# Patient Record
Sex: Female | Born: 2005 | Race: Black or African American | Hispanic: No | Marital: Single | Smoking: Never smoker
Health system: Southern US, Community
[De-identification: ages and names within clinical notes are randomized; demographics above are authoritative.]

## PROBLEM LIST (undated history)

## (undated) ENCOUNTER — Emergency Department (HOSPITAL_COMMUNITY): Admission: EM

## (undated) DIAGNOSIS — Z973 Presence of spectacles and contact lenses: Secondary | ICD-10-CM

## (undated) DIAGNOSIS — L309 Dermatitis, unspecified: Secondary | ICD-10-CM

## (undated) DIAGNOSIS — J309 Allergic rhinitis, unspecified: Secondary | ICD-10-CM

## (undated) DIAGNOSIS — J45909 Unspecified asthma, uncomplicated: Secondary | ICD-10-CM

## (undated) DIAGNOSIS — Z9229 Personal history of other drug therapy: Secondary | ICD-10-CM

## (undated) HISTORY — DX: Unspecified asthma, uncomplicated: J45.909

## (undated) HISTORY — PX: NO PAST SURGERIES: SHX2092

---
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
## 2023-06-09 ENCOUNTER — Encounter (HOSPITAL_COMMUNITY): Payer: Self-pay | Admitting: *Deleted

## 2023-06-09 ENCOUNTER — Other Ambulatory Visit: Payer: Self-pay

## 2023-06-09 ENCOUNTER — Other Ambulatory Visit (HOSPITAL_COMMUNITY): Payer: Self-pay

## 2023-06-09 ENCOUNTER — Ambulatory Visit (HOSPITAL_COMMUNITY)
Admission: EM | Admit: 2023-06-09 | Discharge: 2023-06-09 | Disposition: A | Discharge status: Home / Self Care | Payer: Medicaid Other | Attending: Emergency Medicine | Admitting: Emergency Medicine

## 2023-06-09 DIAGNOSIS — L259 Unspecified contact dermatitis, unspecified cause: Secondary | ICD-10-CM

## 2023-06-09 MED ORDER — CETIRIZINE HCL 10 MG PO TABS
10.0000 mg | ORAL_TABLET | Freq: Every day | ORAL | 2 refills | Status: AC
  Filled 2023-06-09: qty 30, 30d supply, fill #0

## 2023-06-09 NOTE — Discharge Instructions (Addendum)
I recommend to start once daily allergy medicine such as zyrtec or allegra. Take once every morning for the next week or so. This will help with itching.  Apply a little bit of hydrocortisone cream to the neck twice daily for the next 6-7 days. This is an anti-itch cream that should help right away  Return if needed. I recommend to follow with primary care provider as well.

## 2023-06-09 NOTE — ED Triage Notes (Signed)
Pt reports she ate Strawberries on Thursday and has had a rash on her neck. Pt reports the rash area under chin gets worse with she gets hot.

## 2023-06-09 NOTE — ED Provider Notes (Signed)
[facility]    CSN: [id number] Arrival date & time: 06/09/23  1437      History   Chief Complaint Chief Complaint  Patient presents with   Rash    HPI Alison Mcmahon is a 17 y.o. female.  4 days ago developed itching rash on the neck Not anywhere else on the body. Not painful.  No shortness of breath, wheezing, swelling, abd pain, NVD No fever  Denies new products, foods, medications, recent travel. Ate strawberries the day before rash started but has eaten them recently with no concern Didn't know if it was a heat rash  History of eczema  Past Medical History:  Diagnosis Date   Allergic rhinitis    Eczema    Up-to-date with immunizations    Wears glasses     Patient Active Problem List   Diagnosis Date Noted   Metatarsus primus varus of right foot    Acquired hallux valgus of right foot    Major osseous defect     Past Surgical History:  Procedure Laterality Date   HALLUX VALGUS LAPIDUS Right 06/15/2021   Procedure: HALLUX VALGUS LAPIDUS; BUNION, BONE GARAFT FROM RIGHT HEEL;  Surgeon: Edwin Cap, DPM;  Location: [facility];  Service: Podiatry;  Laterality: Right;   NO PAST SURGERIES     WISDOM TOOTH EXTRACTION  01/2021    OB History   No obstetric history on file.      Home Medications    Prior to Admission medications   Medication Sig Start Date End Date Taking? Authorizing Provider  cetirizine (ZYRTEC ALLERGY) 10 MG tablet Take 1 tablet (10 mg total) by mouth daily. 06/09/23  Yes Trentan Trippe, Ray Church    Family History History reviewed. No pertinent family history.  Social History Social History   Tobacco Use   Smoking status: Never   Smokeless tobacco: Never  Vaping Use   Vaping Use: Never used  Substance Use Topics   Alcohol use: Never   Drug use: Never     Allergies   Patient has no known allergies.   Review of Systems Review of Systems  Skin:  Positive for rash.   Per HPI  Physical  Exam Triage Vital Signs ED Triage Vitals  Enc Vitals Group     BP 06/09/23 1547 107/74     Pulse Rate 06/09/23 1547 64     Resp 06/09/23 1547 16     Temp 06/09/23 1547 98.1 F (36.7 C)     Temp src --      SpO2 06/09/23 1547 98 %     Weight --      Height --      Head Circumference --      Peak Flow --      Pain Score 06/09/23 1544 0     Pain Loc --      Pain Edu? --      Excl. in GC? --    No data found.  Updated Vital Signs BP 107/74   Pulse 64   Temp 98.1 F (36.7 C)   Resp 16   LMP 06/02/2023   SpO2 98%    Physical Exam Vitals and nursing note reviewed.  Constitutional:      General: She is not in acute distress.    Appearance: Normal appearance. She is not ill-appearing.  HENT:     Right Ear: Tympanic membrane and ear canal normal.     Left Ear: Tympanic membrane and ear canal  normal.     Nose: No congestion or rhinorrhea.     Mouth/Throat:     Mouth: Mucous membranes are moist.     Pharynx: Oropharynx is clear. No posterior oropharyngeal erythema.     Comments: Normal phonation.  No swelling of lips or tongue. Eyes:     Conjunctiva/sclera: Conjunctivae normal.     Pupils: Pupils are equal, round, and reactive to light.  Cardiovascular:     Rate and Rhythm: Normal rate and regular rhythm.     Pulses: Normal pulses.     Heart sounds: Normal heart sounds.  Pulmonary:     Effort: Pulmonary effort is normal.     Breath sounds: Normal breath sounds.     Comments: Clear throughout  Abdominal:     Palpations: Abdomen is soft.  Musculoskeletal:        General: Normal range of motion.     Cervical back: Normal range of motion.  Skin:    Findings: Rash present.     Comments: Faint rash on anterior neck. No vesicles, erythema, warmth, crusting or drainage.   Neurological:     Mental Status: She is alert and oriented to person, place, and time.      UC Treatments / Results  Labs (all labs ordered are listed, but only abnormal results are  displayed) Labs Reviewed - No data to display  EKG   Radiology No results found.  Procedures Procedures   Medications Ordered in UC Medications - No data to display  Initial Impression / Assessment and Plan / UC Course  I have reviewed the triage vital signs and the nursing notes.  Pertinent labs & imaging results that were available during my care of the patient were reviewed by me and considered in my medical decision making (see chart for details).  Well appearing, sating 98% on room air, no acute distress. Contact dermatitis Unknown etiology. Advised to start once daily allergy med for itch relief. Hydrocortisone cream BID for the next week. Monitor for change in symptoms, return if needed, or follow with primary care provider.   Final Clinical Impressions(s) / UC Diagnoses   Final diagnoses:  Contact dermatitis, unspecified contact dermatitis type, unspecified trigger     Discharge Instructions      I recommend to start once daily allergy medicine such as zyrtec or allegra. Take once every morning for the next week or so. This will help with itching.  Apply a little bit of hydrocortisone cream to the neck twice daily for the next 6-7 days. This is an anti-itch cream that should help right away  Return if needed. I recommend to follow with primary care provider as well.    ED Prescriptions     Medication Sig Dispense Auth. Provider   cetirizine (ZYRTEC ALLERGY) 10 MG tablet Take 1 tablet (10 mg total) by mouth daily. 30 tablet Shaquanda Graves, Lurena Joiner, PA-C      PDMP not reviewed this encounter.   Marlow Baars, [place] 06/09/23 1631

## 2024-06-17 ENCOUNTER — Other Ambulatory Visit: Payer: Self-pay

## 2024-06-17 ENCOUNTER — Emergency Department (HOSPITAL_COMMUNITY)
Admission: EM | Admit: 2024-06-17 | Discharge: 2024-06-18 | Discharge status: Against Medical Advice | Attending: Emergency Medicine | Admitting: Emergency Medicine

## 2024-06-17 ENCOUNTER — Encounter (HOSPITAL_COMMUNITY): Payer: Self-pay | Admitting: *Deleted

## 2024-06-17 ENCOUNTER — Emergency Department (HOSPITAL_COMMUNITY)

## 2024-06-17 DIAGNOSIS — R079 Chest pain, unspecified: Secondary | ICD-10-CM | POA: Insufficient documentation

## 2024-06-17 DIAGNOSIS — Z5321 Procedure and treatment not carried out due to patient leaving prior to being seen by health care provider: Secondary | ICD-10-CM | POA: Diagnosis not present

## 2024-06-17 DIAGNOSIS — R072 Precordial pain: Secondary | ICD-10-CM | POA: Diagnosis not present

## 2024-06-17 DIAGNOSIS — J45909 Unspecified asthma, uncomplicated: Secondary | ICD-10-CM | POA: Diagnosis not present

## 2024-06-17 LAB — CBC
HCT: 38.2 % (ref 36.0–46.0)
Hemoglobin: 12.5 g/dL (ref 12.0–15.0)
MCH: 28.5 pg (ref 26.0–34.0)
MCHC: 32.7 g/dL (ref 30.0–36.0)
MCV: 87.2 fL (ref 80.0–100.0)
Platelets: 304 10*3/uL (ref 150–400)
RBC: 4.38 MIL/uL (ref 3.87–5.11)
RDW: 12.9 % (ref 11.5–15.5)
WBC: 5 10*3/uL (ref 4.0–10.5)
nRBC: 0 % (ref 0.0–0.2)

## 2024-06-17 LAB — TROPONIN I (HIGH SENSITIVITY)
Troponin I (High Sensitivity): 3 ng/L (ref <18)
Troponin I (High Sensitivity): <2 ng/L (ref <18)

## 2024-06-17 LAB — BASIC METABOLIC PANEL WITH GFR
Anion gap: 11 (ref 5–15)
BUN: 9 mg/dL (ref 6–20)
CO2: 22 mmol/L (ref 22–32)
Calcium: 9.6 mg/dL (ref 8.9–10.3)
Chloride: 105 mmol/L (ref 98–111)
Creatinine, Ser: 1.02 mg/dL — ABNORMAL HIGH (ref 0.44–1.00)
GFR, Estimated: >60 mL/min (ref >60)
Glucose, Bld: 92 mg/dL (ref 70–99)
Potassium: 3.8 mmol/L (ref 3.5–5.1)
Sodium: 138 mmol/L (ref 135–145)

## 2024-06-17 LAB — HCG, SERUM, QUALITATIVE: Preg, Serum: NEGATIVE

## 2024-06-17 NOTE — ED Provider Triage Note (Signed)
 Emergency Medicine Provider Triage Evaluation Note  Alison Mcmahon , a 18 y.o. female  was evaluated in triage.  Pt complains of chest pain.  Patient reports several months of intermittent episodes of chest pain.  States this happened back in November 2024.  Denies any other associated symptoms at the same time if chest pain arises such as shortness of breath, nausea, vomiting, or epigastric discomfort.  Reports previous workup that was reassuring.  Not on any medications at this time beyond her albuterol inhaler as she states is not helping her symptoms.  Review of Systems  Positive: As above Negative: As above  Physical Exam  BP (!) 138/97 (BP Location: Right Arm)   Pulse 79   Temp 98.8 F (37.1 C) (Oral)   Resp 15   SpO2 100%  Gen:   Awake, no distress   Resp:  Normal effort  MSK:   Moves extremities without difficulty  Other:    Medical Decision Making  Medically screening exam initiated at 4:03 PM.  Appropriate orders placed.  Alison Mcmahon was informed that the remainder of the evaluation will be completed by another provider, this initial triage assessment does not replace that evaluation, and the importance of remaining in the ED until their evaluation is complete.     Joie Reamer A, PA-C 06/17/24 [phone]

## 2024-06-17 NOTE — ED Triage Notes (Signed)
 Pt arrives via POV. PT reports she has been experiencing intermittent chest pain since this last November. PT arrives AxOx4. Denies any associated symptoms at this time.

## 2024-06-18 ENCOUNTER — Other Ambulatory Visit: Payer: Self-pay

## 2024-06-18 ENCOUNTER — Encounter (HOSPITAL_COMMUNITY): Payer: Self-pay | Admitting: Emergency Medicine

## 2024-06-18 ENCOUNTER — Emergency Department (HOSPITAL_COMMUNITY)
Admission: EM | Admit: 2024-06-18 | Discharge: 2024-06-18 | Disposition: A | Discharge status: Home / Self Care | Source: Home / Self Care | Attending: Emergency Medicine | Admitting: Emergency Medicine

## 2024-06-18 DIAGNOSIS — J45909 Unspecified asthma, uncomplicated: Secondary | ICD-10-CM | POA: Insufficient documentation

## 2024-06-18 DIAGNOSIS — R072 Precordial pain: Secondary | ICD-10-CM | POA: Insufficient documentation

## 2024-06-18 LAB — TROPONIN I (HIGH SENSITIVITY)
Troponin I (High Sensitivity): <2 ng/L (ref <18)
Troponin I (High Sensitivity): <2 ng/L (ref <18)

## 2024-06-18 LAB — BASIC METABOLIC PANEL WITH GFR
Anion gap: 11 (ref 5–15)
BUN: 15 mg/dL (ref 6–20)
CO2: 24 mmol/L (ref 22–32)
Calcium: 9.5 mg/dL (ref 8.9–10.3)
Chloride: 106 mmol/L (ref 98–111)
Creatinine, Ser: 0.89 mg/dL (ref 0.44–1.00)
GFR, Estimated: >60 mL/min (ref >60)
Glucose, Bld: 87 mg/dL (ref 70–99)
Potassium: 3.5 mmol/L (ref 3.5–5.1)
Sodium: 141 mmol/L (ref 135–145)

## 2024-06-18 LAB — CBC
HCT: 39.1 % (ref 36.0–46.0)
Hemoglobin: 13.2 g/dL (ref 12.0–15.0)
MCH: 29.1 pg (ref 26.0–34.0)
MCHC: 33.8 g/dL (ref 30.0–36.0)
MCV: 86.1 fL (ref 80.0–100.0)
Platelets: 319 K/uL (ref 150–400)
RBC: 4.54 MIL/uL (ref 3.87–5.11)
RDW: 13.2 % (ref 11.5–15.5)
WBC: 6.1 K/uL (ref 4.0–10.5)
nRBC: 0 % (ref 0.0–0.2)

## 2024-06-18 LAB — HCG, SERUM, QUALITATIVE: Preg, Serum: NEGATIVE

## 2024-06-18 MED ORDER — IBUPROFEN 600 MG PO TABS
600.0000 mg | ORAL_TABLET | Freq: Four times a day (QID) | ORAL | 0 refills | Status: DC | PRN
Start: 2024-06-18 — End: 2024-11-09
  Filled 2024-06-18: qty 30, 8d supply, fill #0

## 2024-06-18 NOTE — ED Triage Notes (Addendum)
 Pt c/o right upper CP intermittent since November, seen yesterday but LWBS after imaging and blood work. Pain worse with deep inhalation. Pt denies oral BC.

## 2024-06-18 NOTE — ED Provider Notes (Signed)
 [place] EMER[facility]ENCY DEPARTMENT AT [facility] Provider Note   CSN: [id number] Arrival date & time: 06/18/24  1730     Patient presents with: Chest Pain   Alison Mcmahon is a 18 y.o. female who presents with a 87-month history of chest discomfort.  Prior workup was initiated yesterday she come in to be seen however due to wait times was unable to stay and left without being seen.  Pain is worsened with increased physical activity.  She endorses pain that is worse with deep inspiration, x-ray obtained at her visit yesterday did not demonstrate any acute cardiopulmonary issues and workup was unremarkable.  She only endorses a prior history of asthma for which she had previously taken albuterol inhaler but has not felt the need to take it, denies any dyspnea, has not had any nocturnal dyspnea, and has not felt the need to use her inhaler and at least the last year.  She does take Zyrtec  for allergic rhinitis.      Chest Pain      Prior to Admission medications   Medication Sig Start Date End Date Taking? Authorizing Provider  ibuprofen  (ADVIL ) 600 MG tablet Take 1 tablet (600 mg total) by mouth every 6 (six) hours as needed. 06/18/24  Yes Myriam Dorn BROCKS, PA  cetirizine  (ZYRTEC  ALLERGY) 10 MG tablet Take 1 tablet (10 mg total) by mouth daily. 06/09/23   Rising, [place], PA-C    Allergies: Patient has no known allergies.    Review of Systems  Cardiovascular:  Positive for chest pain.  All other systems reviewed and are negative.   Updated Vital Signs BP (!) 131/94 (BP Location: Right Arm)   Pulse 83   Temp 98.9 F (37.2 C)   Resp 18   Ht 5' 3 (1.6 m)   Wt 57.2 kg   LMP 06/11/2024 (Exact Date)   SpO2 100%   BMI 22.32 kg/m   Physical Exam Vitals and nursing note reviewed.  Constitutional:      General: She is not in acute distress.    Appearance: Normal appearance.  HENT:     Head: Normocephalic and atraumatic.     Mouth/Throat:     Mouth: Mucous  membranes are moist.     Pharynx: Oropharynx is clear.  Eyes:     Extraocular Movements: Extraocular movements intact.     Conjunctiva/sclera: Conjunctivae normal.     Pupils: Pupils are equal, round, and reactive to light.  Cardiovascular:     Rate and Rhythm: Normal rate and regular rhythm.     Pulses: Normal pulses.     Heart sounds: Normal heart sounds. No murmur heard.    No friction rub. No gallop.  Pulmonary:     Effort: Pulmonary effort is normal.     Breath sounds: Normal breath sounds.  Abdominal:     General: Abdomen is flat. Bowel sounds are normal.     Palpations: Abdomen is soft.  Musculoskeletal:        General: Normal range of motion.     Cervical back: Normal range of motion and neck supple.     Right lower leg: No edema.     Left lower leg: No edema.  Skin:    General: Skin is warm and dry.     Capillary Refill: Capillary refill takes less than 2 seconds.  Neurological:     General: No focal deficit present.     Mental Status: She is alert. Mental status is at baseline.  Psychiatric:        Mood and Affect: Mood normal.     (all labs ordered are listed, but only abnormal results are displayed) Labs Reviewed  BASIC METABOLIC PANEL WITH GFR  CBC  HCG, SERUM, QUALITATIVE  TROPONIN I (HIGH SENSITIVITY)  TROPONIN I (HIGH SENSITIVITY)    EKG: None  Radiology: DG Chest 2 View Result Date: 06/17/2024 CLINICAL DATA:  Chest pain for 7 months. EXAM: CHEST - 2 VIEW COMPARISON:  None Available. FINDINGS: The cardiomediastinal contours are normal. The lungs are clear. Pulmonary vasculature is normal. No consolidation, pleural effusion, or pneumothorax. No acute osseous abnormalities are seen. IMPRESSION: No active cardiopulmonary disease. Electronically Signed   By: Andrea Gasman M.D.   On: 06/17/2024 16:25     Procedures   Medications Ordered in the ED - No data to display                                  Medical Decision Making Amount and/or  Complexity of Data Reviewed Labs: ordered.  Risk Prescription drug management.   Medical Decision Making:   Alison Mcmahon is a 18 y.o. female who presented to the ED today with chest discomfort detailed above.     Complete initial physical exam performed, notably the patient  was alert and oriented and in no apparent distress.  Physical exam as noted, pulmonary and cardiac auscultation are unremarkable..    Reviewed and confirmed nursing documentation for past medical history, family history, social history.    Initial Assessment:   With the patient's presentation of chest discomfort, differential diagnosis includes costochondritis, ACS, pulmonary embolus, pericarditis/myocarditis.  Initial Plan:  Assess hCG to assess for pregnancy Screening labs including CBC and Metabolic panel to evaluate for infectious or metabolic etiology of disease.  Prior CXR reviewed to evaluate for structural/infectious intrathoracic pathology.  EKG and single troponin to evaluate for cardiac pathology. Single troponin appropriate due to greater than 6 hours since maximal intensity of symptoms. Objective evaluation as below reviewed   Initial Study Results:   Laboratory  All laboratory results reviewed without evidence of clinically relevant pathology.   Exceptions include: None  EKG EKG was reviewed independently. Rate, rhythm, axis, intervals all examined and without medically relevant abnormality. ST segments without concerns for elevations.    Radiology:  All images reviewed independently. Agree with radiology report at this time.   DG Chest 2 View Result Date: 06/17/2024 CLINICAL DATA:  Chest pain for 7 months. EXAM: CHEST - 2 VIEW COMPARISON:  None Available. FINDINGS: The cardiomediastinal contours are normal. The lungs are clear. Pulmonary vasculature is normal. No consolidation, pleural effusion, or pneumothorax. No acute osseous abnormalities are seen. IMPRESSION: No active cardiopulmonary  disease. Electronically Signed   By: Andrea Gasman M.D.   On: 06/17/2024 16:25     Reassessment and Plan:   Extensive workup with this patient is largely unremarkable.  As she has had symptoms for over 7 months, and symptoms have remained stable, believe this may be related to nonspecific chest pain/myocarditis.  Will begin patient on a course of outpatient NSAIDs as noted, follow-up with her primary care within the next 1 to 2 weeks.  This was explained to the patient she understands and agrees has no further concerns at this time.       Final diagnoses:  Precordial pain    ED Discharge Orders  Ordered    ibuprofen  (ADVIL ) 600 MG tablet  Every 6 hours PRN        06/18/24 2149               Myriam Dorn BROCKS, [place] 06/18/24 2154    Yolande Lamar BROCKS, MD 06/22/24 [phone]

## 2024-06-18 NOTE — ED Notes (Signed)
Pt name called to go back to see provider, no response.

## 2024-06-19 ENCOUNTER — Other Ambulatory Visit (HOSPITAL_COMMUNITY): Payer: Self-pay

## 2024-06-21 ENCOUNTER — Other Ambulatory Visit (HOSPITAL_COMMUNITY): Payer: Self-pay

## 2024-07-21 ENCOUNTER — Other Ambulatory Visit (HOSPITAL_COMMUNITY): Payer: Self-pay

## 2024-07-21 MED ORDER — HYDROXYZINE HCL 25 MG PO TABS
25.0000 mg | ORAL_TABLET | Freq: Three times a day (TID) | ORAL | 0 refills | Status: DC | PRN
  Filled 2024-07-21: qty 30, 10d supply, fill #0

## 2024-07-26 ENCOUNTER — Other Ambulatory Visit (HOSPITAL_COMMUNITY): Payer: Self-pay

## 2024-07-26 MED ORDER — VITAMIN D (ERGOCALCIFEROL) 1.25 MG (50000 UNIT) PO CAPS
50000.0000 [IU] | ORAL_CAPSULE | ORAL | 0 refills | Status: DC
  Filled 2024-07-26: qty 8, 56d supply, fill #0

## 2024-10-01 ENCOUNTER — Other Ambulatory Visit (HOSPITAL_COMMUNITY): Payer: Self-pay

## 2024-10-01 MED ORDER — IBUPROFEN 600 MG PO TABS
ORAL_TABLET | ORAL | 1 refills | Status: AC
  Filled 2024-10-01: qty 30, 7d supply, fill #0
  Filled 2024-12-23: qty 30, 7d supply, fill #1

## 2024-10-01 MED ORDER — VITAMIN D (ERGOCALCIFEROL) 1.25 MG (50000 UNIT) PO CAPS
ORAL_CAPSULE | ORAL | 0 refills | Status: AC
  Filled 2024-10-01: qty 8, 56d supply, fill #0

## 2024-10-01 MED ORDER — PREDNISONE 20 MG PO TABS
ORAL_TABLET | ORAL | 0 refills | Status: DC
  Filled 2024-10-01: qty 10, 5d supply, fill #0

## 2024-10-06 ENCOUNTER — Other Ambulatory Visit (HOSPITAL_COMMUNITY): Payer: Self-pay | Admitting: General Practice

## 2024-10-06 DIAGNOSIS — R9431 Abnormal electrocardiogram [ECG] [EKG]: Secondary | ICD-10-CM

## 2024-10-06 DIAGNOSIS — R0602 Shortness of breath: Secondary | ICD-10-CM

## 2024-10-20 ENCOUNTER — Ambulatory Visit (HOSPITAL_COMMUNITY)
Admission: RE | Admit: 2024-10-20 | Discharge: 2024-10-20 | Disposition: A | Discharge status: Home / Self Care | Source: Ambulatory Visit | Attending: Family | Admitting: Family

## 2024-10-20 DIAGNOSIS — R079 Chest pain, unspecified: Secondary | ICD-10-CM | POA: Diagnosis present

## 2024-10-20 DIAGNOSIS — R0602 Shortness of breath: Secondary | ICD-10-CM | POA: Diagnosis not present

## 2024-10-20 DIAGNOSIS — R9431 Abnormal electrocardiogram [ECG] [EKG]: Secondary | ICD-10-CM | POA: Insufficient documentation

## 2024-10-20 LAB — ECHOCARDIOGRAM COMPLETE
Area-P 1/2: 3.85 cm2
Calc EF: 63.3 %
S' Lateral: 2.9 cm
Single Plane A2C EF: 66.7 %
Single Plane A4C EF: 60.9 %

## 2024-10-20 NOTE — Progress Notes (Signed)
  Echocardiogram 2D Echocardiogram has been performed.  Devora Ellouise SAUNDERS 10/20/2024, 12:03 PM

## 2024-10-21 ENCOUNTER — Other Ambulatory Visit (HOSPITAL_COMMUNITY): Payer: Self-pay

## 2024-10-21 MED ORDER — OMEPRAZOLE 20 MG PO CPDR
20.0000 mg | DELAYED_RELEASE_CAPSULE | ORAL | 0 refills | Status: AC
  Filled 2024-10-21: qty 30, 30d supply, fill #0

## 2024-10-21 NOTE — Progress Notes (Signed)
 Subjective    Alison Mcmahon is a 18 year old female here for Follow Up (Pt stated she has not got a called back from cardio)  She was last seen in this office on 10/01/24 by this provider. At that time, she was seen for chest pain and SOB. An EKG was completed that showed some abnormalities and she was referred for an ECHO. She was given a short course of prednisone and instructed to use her Albuterol PRN during the SOB episodes.  She is here today alone and states that she got her ECHO completed x yesterday and completed her chest pain log. She brought the log in with her today. She shares that the chest pain occurs mainly around 3pm and varies in duration. It does not appear to be associated with any activity, excessive worrying, or known triggers.    Documentation Reviewed by Any User: Tobacco  Allergies  Meds  Problems   Med Hx  Surg Hx  Fam Hx  Soc Hx    Current Outpatient Medications  Medication Sig Dispense Refill  . ergocalciferol  (VITAMIN D -2) 1,250 mcg (50,000 unit) capsule 1 capsule once weekly for 8 weeks. 8 Capsule 0  . ibuprofen  600 mg tablet Take 1 Tablet by mouth every 6 (six) hours as needed for pain. 30 Tablet 1  . cetirizine  (ZYRTEC ) 10 mg tablet Take 10 mg by mouth.    . hydrOXYzine  HCL (ATARAX ) 25 mg tablet Take 1 Tablet by mouth 3 (three) times daily as needed for anxiety for up to 30 days. 30 Tablet 0  . VENTOLIN HFA 90 mcg/actuation inhaler      Review of Systems  Constitutional:  Negative for appetite change, chills, diaphoresis, fatigue and fever.  Respiratory:  Positive for chest tightness and shortness of breath.   Cardiovascular:  Negative for chest pain and palpitations.  Psychiatric/Behavioral:  Negative for self-injury and suicidal ideas.    Objective    BP 112/79 (Left Arm, Sitting, Regular Adult)  Pulse 83  Temp 98.7 F (37.1 C)  Wt 138 lb (62.6 kg)  LMP 10/10/2024 (Exact Date)  SpO2 97%  BMI 24.45 kg/m  OB Status Having periods  Smoking Status  Never  BSA 1.67 m  Physical Exam Vitals and nursing note reviewed.   Constitutional:      General: She is not in acute distress.    Appearance: Normal appearance. She is normal weight. She is not ill-appearing, toxic-appearing or diaphoretic.   Cardiovascular:     Rate and Rhythm: Normal rate and regular rhythm.     Heart sounds: Normal heart sounds.  Pulmonary:     Effort: Pulmonary effort is normal.     Breath sounds: Normal breath sounds.  Neurological:     Mental Status: She is alert and oriented to person, place, and time. Mental status is at baseline.     Cranial Nerves: No cranial nerve deficit.  Psychiatric:        Mood and Affect: Mood normal.        Behavior: Behavior normal.        Thought Content: Thought content normal.        Judgment: Judgment normal.     Assessment and Plan    1. Chest pain, unspecified type (Primary) 2. Abnormal electrocardiogram (ECG) (EKG) -Reviewed ECHO from 10/20/24 that was unremarkable with an EF: 65-70%.  -Reviewed chest pain log and appears that chest pain comes mainly when lying down; concerns for possible GERD, but she does not note any issues  when eating/skipping meals.  -Instructed to continue using Albuterol PRN SOB episodes and will refer to Cardiology for further workup.  - REFERRAL TO CARDIOLOGY  3. Epigastric abdominal pain -Will trial on Prilosec to r/o epigastric pain/GERD as etiology. May also consider h.pylori testing in the future if no resolve with Cardiology referral. - omeprazole (PRILOSEC) 20 mg DR capsule; Take 1 Capsule by mouth every morning before breakfast.  Dispense: 30 Capsule; Refill: 0  Regulatory Documentation: The following were addressed in today's visit: Regulatory documentation not addressed.  Annabella Rigg, NP Family Nurse Practitioner [facility]

## 2024-11-09 ENCOUNTER — Ambulatory Visit: Attending: Physician Assistant

## 2024-11-09 ENCOUNTER — Ambulatory Visit: Payer: Self-pay | Admitting: Physician Assistant

## 2024-11-09 ENCOUNTER — Ambulatory Visit: Attending: Physician Assistant | Admitting: Physician Assistant

## 2024-11-09 ENCOUNTER — Encounter: Payer: Self-pay | Admitting: Physician Assistant

## 2024-11-09 ENCOUNTER — Other Ambulatory Visit: Payer: Self-pay | Admitting: Physician Assistant

## 2024-11-09 VITALS — BP 90/60 | HR 88 | Ht 63.0 in | Wt 139.8 lb

## 2024-11-09 DIAGNOSIS — R9431 Abnormal electrocardiogram [ECG] [EKG]: Secondary | ICD-10-CM | POA: Diagnosis not present

## 2024-11-09 DIAGNOSIS — R7989 Other specified abnormal findings of blood chemistry: Secondary | ICD-10-CM

## 2024-11-09 DIAGNOSIS — R002 Palpitations: Secondary | ICD-10-CM

## 2024-11-09 DIAGNOSIS — R079 Chest pain, unspecified: Secondary | ICD-10-CM

## 2024-11-09 LAB — D-DIMER, QUANTITATIVE: D-DIMER: 0.93 mg{FEU}/L — ABNORMAL HIGH (ref 0.00–0.49)

## 2024-11-09 NOTE — Patient Instructions (Addendum)
 Medication Instructions:  Your physician recommends that you continue on your current medications as directed. Please refer to the Current Medication list given to you today.  *If you need a refill on your cardiac medications before your next appointment, please call your pharmacy*  Lab Work: TODAY:  DDIMER  If you have labs (blood work) drawn today and your tests are completely normal, you will receive your results only by: MyChart Message (if you have MyChart) OR A paper copy in the mail If you have any lab test that is abnormal or we need to change your treatment, we will call you to review the results.  Testing/Procedures: Your physician has requested that you have an exercise tolerance test. For further information please visit [email or web address] Please also follow instruction sheet, as given BELOW:  - DO NOT EAT, DRINK, OR USE TOBACCO PRODUCTS WITHIN 4 HOURS OF THE TEST - DRESS PREPARED TO EXERCISE IN A COMFORTABLE, 2 PIECE CLOTHING OUTFIT AND WALKING SHOES - BRING ANY PRESCRIPTION MEDICATIONS WITH YOU  - NOTIFY THE OFFICE 24 HOURS IN ADVANCE IF YOU NEED TO CANCEL - CALL THE OFFICE [phone] IF YOU HAVE ANY QUESTIONS   ZIO XT- Long Term Monitor Instructions  Your physician has requested you wear a ZIO patch monitor for 14 days.  This is a single patch monitor. [employer] supplies one patch monitor per enrollment. Additional stickers are not available. Please do not apply patch if you will be having a Nuclear Stress Test,  Echocardiogram, Cardiac CT, MRI, or Chest Xray during the period you would be wearing the  monitor. The patch cannot be worn during these tests. You cannot remove and re-apply the  ZIO XT patch monitor.  Your ZIO patch monitor will be mailed 3 day USPS to your address on file. It may take 3-5 days  to receive your monitor after you have been enrolled.  Once you have received your monitor, please review the enclosed instructions. Your monitor  has already  been registered assigning a specific monitor serial # to you.  Billing and Patient Assistance Program Information  We have supplied [employer] with any of your insurance information on file for billing purposes. [employer] offers a sliding scale Patient Assistance Program for patients that do not have  insurance, or whose insurance does not completely cover the cost of the ZIO monitor.  You must apply for the Patient Assistance Program to qualify for this discounted rate.  To apply, please call [employer] at [phone], select option 4, select option 2, ask to apply for  Patient Assistance Program. Meredeth will ask your household income, and how many people  are in your household. They will quote your out-of-pocket cost based on that information.  [employer] will also be able to set up a 15-month, interest-free payment plan if needed.  Applying the monitor   Shave hair from upper left chest.  Hold abrader disc by orange tab. Rub abrader in 40 strokes over the upper left chest as  indicated in your monitor instructions.  Clean area with 4 enclosed alcohol pads. Let dry.  Apply patch as indicated in monitor instructions. Patch will be placed under collarbone on left  side of chest with arrow pointing upward.  Rub patch adhesive wings for 2 minutes. Remove white label marked 1. Remove the white  label marked 2. Rub patch adhesive wings for 2 additional minutes.  While looking in a mirror, press and release button in center of patch. A small green light will  flash 3-4 times.  This will be your only indicator that the monitor has been turned on.  Do not shower for the first 24 hours. You may shower after the first 24 hours.  Press the button if you feel a symptom. You will hear a small click. Record Date, Time and  Symptom in the Patient Logbook.  When you are ready to remove the patch, follow instructions on the last 2 pages of Patient  Logbook. Stick patch monitor onto the last page of Patient  Logbook.  Place Patient Logbook in the blue and white box. Use locking tab on box and tape box closed  securely. The blue and white box has prepaid postage on it. Please place it in the mailbox as  soon as possible. Your physician should have your test results approximately 7 days after the  monitor has been mailed back to [facility].  Call [facility] Customer Care at [phone] if you have questions regarding  your ZIO XT patch monitor. Call them immediately if you see an orange light blinking on your  monitor.  If your monitor falls off in less than 4 days, contact our Monitor department at [phone].  If your monitor becomes loose or falls off after 4 days call [employer] at [phone] for  suggestions on securing your monitor   Follow-Up: At [facility], you and your health needs are our priority.  As part of our continuing mission to provide you with exceptional heart care, our providers are all part of one team.  This team includes your primary Cardiologist (physician) and Advanced Practice Providers or APPs (Physician Assistants and Nurse Practitioners) who all work together to provide you with the care you need, when you need it.  Your next appointment:   6 week(s) (ALREADY SCHEDULED)   Provider:   Hao Meng, PA-C          We recommend signing up for the patient portal called MyChart.  Sign up information is provided on this After Visit Summary.  MyChart is used to connect with patients for Virtual Visits (Telemedicine).  Patients are able to view lab/test results, encounter notes, upcoming appointments, etc.  Non-urgent messages can be sent to your provider as well.   To learn more about what you can do with MyChart, go to [email or web address].   Other Instructions

## 2024-11-09 NOTE — Progress Notes (Unsigned)
Enrolled for [employer] to mail a ZIO XT long term holter monitor to the patients address on file.   Dr. Chandrasekhar to read. 

## 2024-11-09 NOTE — Progress Notes (Unsigned)
 Cardiology Office Note   Date:  11/11/2024  ID:  Alison, Mcmahon 12-21-2005, MRN [id number] PCP: [facility]  [facility] Providers Cardiologist:  [facility]   History of Present Illness Alison Mcmahon is a 18 y.o. female with no significant past medical history who was recently seen by PCP for evaluation of chest pain or shortness of breath.  Symptoms started in November 2024, stopped for a while then came back in May 2025.  She was seen in the ED and was prescribed ibuprofen  600 mg was a little improvement.  She is a barista.  She was seen in the ED on 06/17/2024 and 06/18/2024.  Blood work obtained at the time showed white blood cell count 6.1, hemoglobin 13.2, platelets 319.  Sodium 141, potassium 3.5, creatinine 0.89.  Troponin was negative x 2 on 7/3 and negative x 2 again on 06/18/2024.  Pregnancy test are negative on both days.  Lipid panel obtained in August 2025 showed a total cholesterol 152, HDL 50, LDL 91, triglyceride 53.  Hemoglobin A1c was 5.4.  TSH 0.492, free T41.18.  Vitamin D  low at 16, normal 30+.  Chest x-ray obtained on 07/14/2024 was normal.  Her PCP has prescribed a course of steroid for her.  She ultimately had a echocardiogram on 10/20/2024 that showed EF 65 to 70%, no regional wall motion abnormality, normal RV with RVSP 18.8 mmHg, trivial MR.  There was no significant pericardial effusion.  Patient presents [facility] for [facility] evaluation of intermittent chest pain since November 2024.  Symptom has been going on for a year.  It first started when she was ready to take the final exam near the end of 2024.  Her school nurse initially prescribed some breathing treatment for her thinking it was asthma.  Symptoms did worsen with exertion along with shortness of breath as well.  Breathing treatment did help initially however symptoms keep coming back.  She described it as a substernal pressure-like sensation that  is worse with deep inspiration and deep palpation.  Her chest discomfort would last from several minutes to up to an hour.  She also described occasionally tachypalpitations symptom that would last several minutes.  She is not on oral contraceptive.  She is fairly active however I cannot completely rule out the possibility of PE.  I will obtain D-dimer.  For her palpitation, I will also obtain a ZIO XT monitor.  I discussed her case with DOD Dr. Arnetha, for her atypical chest pain, we will order a plain old treadmill test.  Although initial EKG suggested biatrial enlargement, however she did not have any atrial enlargement on the echocardiogram.  Blood pressure is mildly low today, however this is her normal blood pressure.  She says steroid treatment and Prilosec has not helped with her symptoms.  ROS:   Patient complains of chest pain and palpitation.  Chest pain was worse with deep inspiration.  Studies Reviewed EKG Interpretation Date/Time:  Tuesday November 09 2024 13:46:13 EST Ventricular Rate:  96 PR Interval:  124 QRS Duration:  66 QT Interval:  334 QTC Calculation: 421 R Axis:   80  Text Interpretation: Normal sinus rhythm with sinus arrhythmia Biatrial enlargement When compared with ECG of 18-Jun-2024 17:36, Nonspecific T wave abnormality no longer evident in Lateral leads Confirmed by Janene Boer [phone]) on 11/09/2024 1:47:31 PM    Cardiac Studies & Procedures   ______________________________________________________________________________________________     ECHOCARDIOGRAM  ECHOCARDIOGRAM COMPLETE 10/20/2024  Narrative ECHOCARDIOGRAM REPORT    Patient Name:   Alison Mcmahon Date of Exam: 10/20/2024 Medical Rec #:  [id number]         Height:       63.0 in Accession #:    [id number]        Weight:       126.0 lb Date of Birth:  [birth year]         BSA:          1.589 m Patient Age:    18 years          BP:           127/87 mmHg Patient Gender: F                 HR:            80 bpm. Exam Location:  Outpatient  Procedure: 2D Echo, 3D Echo, Cardiac Doppler, Color Doppler and Strain Analysis (Both Spectral and Color Flow Doppler were utilized during procedure).  Indications:    R06.02 SOB; R07.9* Chest pain, unspecified  History:        Patient has no prior history of Echocardiogram examinations. Signs/Symptoms:Chest Pain, Dyspnea and Shortness of Breath. No prior cardiac history.  Sonographer:    Ellouise Mose RDCS Referring Phys: [id number] TIFFANY N LUCAS  IMPRESSIONS   1. Left ventricular ejection fraction, by estimation, is 65 to 70%. Left ventricular ejection fraction by 3D volume is 60 %. The left ventricle has normal function. The left ventricle has no regional wall motion abnormalities. Left ventricular diastolic parameters were normal. The average left ventricular global longitudinal strain is -21.3 %. The global longitudinal strain is normal. 2. Right ventricular systolic function is normal. The right ventricular size is normal. There is normal pulmonary artery systolic pressure. The estimated right ventricular systolic pressure is 18.8 mmHg. 3. The mitral valve is normal in structure. Trivial mitral valve regurgitation. No evidence of mitral stenosis. 4. The aortic valve is tricuspid. Aortic valve regurgitation is not visualized. No aortic stenosis is present.  Comparison(s): No prior Echocardiogram.  FINDINGS Left Ventricle: Left ventricular ejection fraction, by estimation, is 65 to 70%. Left ventricular ejection fraction by 3D volume is 60 %. The left ventricle has normal function. The left ventricle has no regional wall motion abnormalities. The average left ventricular global longitudinal strain is -21.3 %. Strain was performed and the global longitudinal strain is normal. The left ventricular internal cavity size was normal in size. There is no left ventricular hypertrophy. Left ventricular diastolic parameters were normal.  Right  Ventricle: The right ventricular size is normal. No increase in right ventricular wall thickness. Right ventricular systolic function is normal. There is normal pulmonary artery systolic pressure. The tricuspid regurgitant velocity is 1.99 m/s, and with an assumed right atrial pressure of 3 mmHg, the estimated right ventricular systolic pressure is 18.8 mmHg.  Left Atrium: Left atrial size was normal in size.  Right Atrium: Right atrial size was normal in size.  Pericardium: There is no evidence of pericardial effusion.  Mitral Valve: The mitral valve is normal in structure. Trivial mitral valve regurgitation. No evidence of mitral valve stenosis.  Tricuspid Valve: The tricuspid valve is normal in structure. Tricuspid valve regurgitation is trivial. No evidence of tricuspid stenosis.  Aortic Valve: The aortic valve is tricuspid. Aortic valve regurgitation is not visualized. No aortic stenosis is present.  Pulmonic Valve: The pulmonic valve was normal in structure. Pulmonic valve regurgitation is not visualized. No  evidence of pulmonic stenosis.  Aorta: The aortic root and ascending aorta are structurally normal, with no evidence of dilitation.  IAS/Shunts: There is redundancy of the interatrial septum. No atrial level shunt detected by color flow Doppler.  Additional Comments: 3D was performed not requiring image post processing on an independent workstation and was normal.   LEFT VENTRICLE PLAX 2D LVIDd:         4.60 cm         Diastology LVIDs:         2.90 cm         LV e' medial:    12.70 cm/s LV PW:         0.90 cm         LV E/e' medial:  5.9 LV IVS:        0.70 cm         LV e' lateral:   17.00 cm/s LVOT diam:     2.00 cm         LV E/e' lateral: 4.4 LV SV:         53 LV SV Index:   33              2D Longitudinal LVOT Area:     3.14 cm        Strain LV IVRT:       85 msec         2D Strain GLS   -21.3 % Avg:  LV Volumes (MOD)               3D Volume EF LV vol d, MOD     83.5 ml       LV 3D EF:    Left A2C:                                        ventricul LV vol d, MOD    68.6 ml                    ar A4C:                                        ejection LV vol s, MOD    27.8 ml                    fraction A2C:                                        by 3D LV vol s, MOD    26.8 ml                    volume is A4C:                                        60 %. LV SV MOD A2C:   55.7 ml LV SV MOD A4C:   68.6 ml LV SV MOD BP:    49.1 ml       3D Volume EF: 3D EF:        60 % LV EDV:  120 ml LV ESV:       48 ml LV SV:        72 ml  RIGHT VENTRICLE             IVC RV S prime:     13.70 cm/s  IVC diam: 2.00 cm TAPSE (M-mode): 2.3 cm PULMONARY VEINS Diastolic Velocity: 50.10 cm/s S/D Velocity:       1.10 Systolic Velocity:  55.70 cm/s  LEFT ATRIUM             Index        RIGHT ATRIUM           Index LA diam:        2.50 cm 1.57 cm/m   RA Area:     10.70 cm LA Vol (A2C):   21.2 ml 13.34 ml/m  RA Volume:   23.10 ml  14.54 ml/m LA Vol (A4C):   15.3 ml 9.63 ml/m LA Biplane Vol: 19.1 ml 12.02 ml/m AORTIC VALVE LVOT Vmax:   95.80 cm/s LVOT Vmean:  63.700 cm/s LVOT VTI:    0.168 m  AORTA Ao Root diam: 2.80 cm Ao Asc diam:  2.40 cm  MITRAL VALVE               TRICUSPID VALVE MV Area (PHT): 3.85 cm    TR Peak grad:   15.8 mmHg MV Decel Time: 197 msec    TR Vmax:        199.00 cm/s MV E velocity: 75.50 cm/s MV A velocity: 72.70 cm/s  SHUNTS MV E/A ratio:  1.04        Systemic VTI:  0.17 m Systemic Diam: 2.00 cm  Emeline Calender Electronically signed by Emeline Calender Signature Date/Time: 10/20/2024/3:25:18 PM    Final          ______________________________________________________________________________________________      Risk Assessment/Calculations          Physical Exam VS:  BP 90/60   Pulse 88   Ht 5' 3 (1.6 m)   Wt 139 lb 12.8 oz (63.4 kg)   SpO2 98%   BMI 24.76 kg/m        Wt Readings from Last 3 Encounters:   11/09/24 139 lb 12.8 oz (63.4 kg)  06/18/24 126 lb (57.2 kg)  06/15/21 119 lb 3.2 oz (54.1 kg) (43%, Z= -0.18)*   * Growth percentiles are based on CDC (Girls, 2-20 Years) data.    GEN: Well nourished, well developed in no acute distress NECK: No JVD; No carotid bruits CARDIAC: RRR, no murmurs, rubs, gallops RESPIRATORY:  Clear to auscultation without rales, wheezing or rhonchi  ABDOMEN: Soft, non-tender, non-distended EXTREMITIES:  No edema; No deformity   ASSESSMENT AND PLAN  Palpitation: will ordered ZIO XT monitor.  Chest pain: Atypical symptom that is worse with deep inspiration.  I will obtain plain old treadmill test.  Cannot rule out PE, will obtain D-dimer.  If D-dimer is positive, then we will need CTA of the chest.    Informed Consent   Shared Decision Making/Informed Consent The risks [chest pain, shortness of breath, cardiac arrhythmias, dizziness, blood pressure fluctuations, myocardial infarction, stroke/transient ischemic attack, and life-threatening complications (estimated to be 1 in 10,000)], benefits (risk stratification, diagnosing coronary artery disease, treatment guidance) and alternatives of an exercise tolerance test were discussed in detail with Ms. Mcquown and she agrees to proceed.     Dispo: Follow-up in 6 weeks  Signed, Jurline Folger, PA

## 2024-11-13 LAB — BASIC METABOLIC PANEL WITH GFR
BUN/Creatinine Ratio: 13 (ref 9–23)
BUN: 11 mg/dL (ref 6–20)
CO2: 22 mmol/L (ref 20–29)
Calcium: 9.5 mg/dL (ref 8.7–10.2)
Chloride: 100 mmol/L (ref 96–106)
Creatinine, Ser: 0.82 mg/dL (ref 0.57–1.00)
Glucose: 89 mg/dL (ref 70–99)
Potassium: 4.6 mmol/L (ref 3.5–5.2)
Sodium: 138 mmol/L (ref 134–144)
eGFR: 105 mL/min/1.73 (ref >59)

## 2024-11-25 ENCOUNTER — Telehealth (HOSPITAL_COMMUNITY): Payer: Self-pay | Admitting: *Deleted

## 2024-11-25 NOTE — Telephone Encounter (Signed)
Pt given instructions for ETT.

## 2024-12-01 ENCOUNTER — Ambulatory Visit (HOSPITAL_COMMUNITY): Admission: RE | Admit: 2024-12-01 | Discharge: 2024-12-01 | Attending: Physician Assistant

## 2024-12-01 DIAGNOSIS — R7989 Other specified abnormal findings of blood chemistry: Secondary | ICD-10-CM | POA: Diagnosis present

## 2024-12-01 DIAGNOSIS — R079 Chest pain, unspecified: Secondary | ICD-10-CM

## 2024-12-01 MED ORDER — IOHEXOL 350 MG/ML SOLN
75.0000 mL | Freq: Once | INTRAVENOUS | Status: AC | PRN
  Administered 2024-12-01: 08:00:00 75 mL via INTRAVENOUS

## 2024-12-02 ENCOUNTER — Inpatient Hospital Stay (HOSPITAL_COMMUNITY)
Admission: RE | Admit: 2024-12-02 | Discharge: 2024-12-02 | Disposition: A | Source: Ambulatory Visit | Attending: Cardiovascular Disease | Admitting: Cardiovascular Disease

## 2024-12-02 DIAGNOSIS — R079 Chest pain, unspecified: Secondary | ICD-10-CM

## 2024-12-02 DIAGNOSIS — R002 Palpitations: Secondary | ICD-10-CM | POA: Diagnosis present

## 2024-12-11 DIAGNOSIS — R002 Palpitations: Secondary | ICD-10-CM

## 2024-12-11 DIAGNOSIS — R9431 Abnormal electrocardiogram [ECG] [EKG]: Secondary | ICD-10-CM | POA: Diagnosis not present

## 2024-12-11 DIAGNOSIS — R079 Chest pain, unspecified: Secondary | ICD-10-CM | POA: Diagnosis not present

## 2024-12-14 ENCOUNTER — Ambulatory Visit: Payer: Self-pay | Admitting: Physician Assistant

## 2024-12-14 LAB — EXERCISE TOLERANCE TEST
Angina Index: 0
Base ST Depression (mm): 0 mm
Duke Treadmill Score: 9
Estimated workload: 10.1
Exercise duration (min): 9 min
Exercise duration (sec): 0 s
MPHR: 200 {beats}/min
Peak HR: 190 {beats}/min
Percent HR: 95 %
Rest HR: 116 {beats}/min
ST Depression (mm): 0 mm

## 2024-12-15 NOTE — Progress Notes (Signed)
 Delayed reading, but overall, reassuring study. No evidence of ischemia.

## 2024-12-23 ENCOUNTER — Other Ambulatory Visit (HOSPITAL_COMMUNITY): Payer: Self-pay

## 2024-12-23 ENCOUNTER — Ambulatory Visit: Attending: Physician Assistant | Admitting: Physician Assistant

## 2024-12-23 VITALS — BP 106/78 | HR 72 | Ht 63.0 in | Wt 140.0 lb

## 2024-12-23 DIAGNOSIS — R0789 Other chest pain: Secondary | ICD-10-CM | POA: Diagnosis present

## 2024-12-23 DIAGNOSIS — R002 Palpitations: Secondary | ICD-10-CM | POA: Diagnosis present

## 2024-12-23 NOTE — Patient Instructions (Signed)
 Medication Instructions:  NO CHANGES *If you need a refill on your cardiac medications before your next appointment, please call your pharmacy*  Lab Work: NO LABS If you have labs (blood work) drawn today and your tests are completely normal, you will receive your results only by: MyChart Message (if you have MyChart) OR A paper copy in the mail If you have any lab test that is abnormal or we need to change your treatment, we will call you to review the results.  Testing/Procedures: NO TESTING  Follow-Up: At [facility], you and your health needs are our priority.  As part of our continuing mission to provide you with exceptional heart care, our providers are all part of one team.  This team includes your primary Cardiologist (physician) and Advanced Practice Providers or APPs (Physician Assistants and Nurse Practitioners) who all work together to provide you with the care you need, when you need it.  Your next appointment:   FOLLOW UP AS NEEDED

## 2024-12-23 NOTE — Progress Notes (Signed)
 " Cardiology Office Note   Date:  12/24/2024  ID:  [name], [name] 2006-03-09, MRN [id number] PCP: [facility]  [facility] Providers Cardiologist:  Dr. Jordan  History of Present Illness Alison Mcmahon is a 19 y.o. female with no significant past medical history who was recently seen by PCP for evaluation of chest pain or shortness of breath.  Symptoms started in November 2024, stopped for a while then came back in May 2025.  She was seen in the ED and was prescribed ibuprofen  600 mg was a little improvement.  She is a barista.  She was seen in the ED on 06/17/2024 and 06/18/2024.  Blood work obtained at the time showed white blood cell count 6.1, hemoglobin 13.2, platelets 319.  Sodium 141, potassium 3.5, creatinine 0.89.  Troponin was negative x 2 on 7/3 and negative x 2 again on 06/18/2024.  Pregnancy test are negative on both days.  Lipid panel obtained in August 2025 showed a total cholesterol 152, HDL 50, LDL 91, triglyceride 53.  Hemoglobin A1c was 5.4.  TSH 0.492, free T41.18.  Vitamin D  low at 16, normal 30+.  Chest x-ray obtained on 07/14/2024 was normal.  Her PCP has prescribed a course of steroid for her.  She ultimately had a echocardiogram on 10/20/2024 that showed EF 65 to 70%, no regional wall motion abnormality, normal RV with RVSP 18.8 mmHg, trivial MR.  There was no significant pericardial effusion.   I last [facility] the [facility] on 11/09/2024, her symptom has been going on for a year, it for started when she was ready to take final exam near the end of 2024.  Her school nurse initially prescribed her breathing treatment for her thinking was asthma.  Symptom did worsen with exertion along with shortness of breath as well.  Breathing treatment helped initially however symptoms kept coming back.  I ordered a D-dimer which came back positive, subsequent CTA chest was negative.  Treadmill test was negative as well for  EKG changes.  ZIO monitor showed normal sinus rhythm, no sign of irregular rhythm despite having 99 patient triggered events and 50 diary events.  She continued to have occasional right-sided chest discomfort, however her symptoms are quite transient.  We reviewed the recent treadmill stress test and her monitor, at this time I do not recommend any further cardiac workup.  She can follow-up with cardiology service as needed.  Some of her symptoms may be related to symptomatic PVCs, however her PVC burden is very rare and does not warrant any beta-blocker.  ROS:   She has rare palpitation and right-sided chest discomfort.   Studies Reviewed      Cardiac Studies & Procedures   ______________________________________________________________________________________________   STRESS TESTS  EXERCISE TOLERANCE TEST (ETT) 12/02/2024  Interpretation Summary   Resting ECG shows non specific T wave abnormality   A Bruce protocol stress test was performed. Exercise capacity was normal. Patient exercised for 9 min and 0 sec. Maximum HR of 190 bpm. MPHR 95.0%. Peak METS 10.1. The test was stopped because the patient experienced fatigue. The patient reported dyspnea during the stress test. Normal blood pressure and normal heart rate response noted during stress. Heart rate recovery was normal.   No ST deviation was noted. ECG was interpretable and conclusive. The ECG was negative for ischemia.   This is a low risk study.   ECHOCARDIOGRAM  ECHOCARDIOGRAM COMPLETE 10/20/2024  Narrative ECHOCARDIOGRAM  REPORT    Patient Name:   Alison Mcmahon Date of Exam: 10/20/2024 Medical Rec #:  [id number]         Height:       63.0 in Accession #:    [id number]        Weight:       126.0 lb Date of Birth:  2006/07/23         BSA:          1.589 m Patient Age:    18 years          BP:           127/87 mmHg Patient Gender: F                 HR:           80 bpm. Exam Location:  Outpatient  Procedure: 2D  Echo, 3D Echo, Cardiac Doppler, Color Doppler and Strain Analysis (Both Spectral and Color Flow Doppler were utilized during procedure).  Indications:    R06.02 SOB; R07.9* Chest pain, unspecified  History:        Patient has no prior history of Echocardiogram examinations. Signs/Symptoms:Chest Pain, Dyspnea and Shortness of Breath. No prior cardiac history.  Sonographer:    Ellouise Mose RDCS Referring Phys: [id number] TIFFANY N LUCAS  IMPRESSIONS   1. Left ventricular ejection fraction, by estimation, is 65 to 70%. Left ventricular ejection fraction by 3D volume is 60 %. The left ventricle has normal function. The left ventricle has no regional wall motion abnormalities. Left ventricular diastolic parameters were normal. The average left ventricular global longitudinal strain is -21.3 %. The global longitudinal strain is normal. 2. Right ventricular systolic function is normal. The right ventricular size is normal. There is normal pulmonary artery systolic pressure. The estimated right ventricular systolic pressure is 18.8 mmHg. 3. The mitral valve is normal in structure. Trivial mitral valve regurgitation. No evidence of mitral stenosis. 4. The aortic valve is tricuspid. Aortic valve regurgitation is not visualized. No aortic stenosis is present.  Comparison(s): No prior Echocardiogram.  FINDINGS Left Ventricle: Left ventricular ejection fraction, by estimation, is 65 to 70%. Left ventricular ejection fraction by 3D volume is 60 %. The left ventricle has normal function. The left ventricle has no regional wall motion abnormalities. The average left ventricular global longitudinal strain is -21.3 %. Strain was performed and the global longitudinal strain is normal. The left ventricular internal cavity size was normal in size. There is no left ventricular hypertrophy. Left ventricular diastolic parameters were normal.  Right Ventricle: The right ventricular size is normal. No increase in right  ventricular wall thickness. Right ventricular systolic function is normal. There is normal pulmonary artery systolic pressure. The tricuspid regurgitant velocity is 1.99 m/s, and with an assumed right atrial pressure of 3 mmHg, the estimated right ventricular systolic pressure is 18.8 mmHg.  Left Atrium: Left atrial size was normal in size.  Right Atrium: Right atrial size was normal in size.  Pericardium: There is no evidence of pericardial effusion.  Mitral Valve: The mitral valve is normal in structure. Trivial mitral valve regurgitation. No evidence of mitral valve stenosis.  Tricuspid Valve: The tricuspid valve is normal in structure. Tricuspid valve regurgitation is trivial. No evidence of tricuspid stenosis.  Aortic Valve: The aortic valve is tricuspid. Aortic valve regurgitation is not visualized. No aortic stenosis is present.  Pulmonic Valve: The pulmonic valve was normal in structure. Pulmonic valve regurgitation is not visualized. No evidence of  pulmonic stenosis.  Aorta: The aortic root and ascending aorta are structurally normal, with no evidence of dilitation.  IAS/Shunts: There is redundancy of the interatrial septum. No atrial level shunt detected by color flow Doppler.  Additional Comments: 3D was performed not requiring image post processing on an independent workstation and was normal.   LEFT VENTRICLE PLAX 2D LVIDd:         4.60 cm         Diastology LVIDs:         2.90 cm         LV e' medial:    12.70 cm/s LV PW:         0.90 cm         LV E/e' medial:  5.9 LV IVS:        0.70 cm         LV e' lateral:   17.00 cm/s LVOT diam:     2.00 cm         LV E/e' lateral: 4.4 LV SV:         53 LV SV Index:   33              2D Longitudinal LVOT Area:     3.14 cm        Strain LV IVRT:       85 msec         2D Strain GLS   -21.3 % Avg:  LV Volumes (MOD)               3D Volume EF LV vol d, MOD    83.5 ml       LV 3D EF:    Left A2C:                                         ventricul LV vol d, MOD    68.6 ml                    ar A4C:                                        ejection LV vol s, MOD    27.8 ml                    fraction A2C:                                        by 3D LV vol s, MOD    26.8 ml                    volume is A4C:                                        60 %. LV SV MOD A2C:   55.7 ml LV SV MOD A4C:   68.6 ml LV SV MOD BP:    49.1 ml       3D Volume EF: 3D EF:        60 % LV EDV:  120 ml LV ESV:       48 ml LV SV:        72 ml  RIGHT VENTRICLE             IVC RV S prime:     13.70 cm/s  IVC diam: 2.00 cm TAPSE (M-mode): 2.3 cm PULMONARY VEINS Diastolic Velocity: 50.10 cm/s S/D Velocity:       1.10 Systolic Velocity:  55.70 cm/s  LEFT ATRIUM             Index        RIGHT ATRIUM           Index LA diam:        2.50 cm 1.57 cm/m   RA Area:     10.70 cm LA Vol (A2C):   21.2 ml 13.34 ml/m  RA Volume:   23.10 ml  14.54 ml/m LA Vol (A4C):   15.3 ml 9.63 ml/m LA Biplane Vol: 19.1 ml 12.02 ml/m AORTIC VALVE LVOT Vmax:   95.80 cm/s LVOT Vmean:  63.700 cm/s LVOT VTI:    0.168 m  AORTA Ao Root diam: 2.80 cm Ao Asc diam:  2.40 cm  MITRAL VALVE               TRICUSPID VALVE MV Area (PHT): 3.85 cm    TR Peak grad:   15.8 mmHg MV Decel Time: 197 msec    TR Vmax:        199.00 cm/s MV E velocity: 75.50 cm/s MV A velocity: 72.70 cm/s  SHUNTS MV E/A ratio:  1.04        Systemic VTI:  0.17 m Systemic Diam: 2.00 cm  Emeline Calender Electronically signed by Emeline Calender Signature Date/Time: 10/20/2024/3:25:18 PM    Final    MONITORS  LONG TERM MONITOR (3-14 DAYS) 12/06/2024  Narrative   Patient had a minimum heart rate of 50 bpm, maximum heart rate of 177 bpm, and average heart rate of 88 bpm. Predominant underlying rhythm was sinus. Isolated PACs were rare (<1.0%). Isolated PVCs were rare (<1.0%). Triggered and diary events associated with sinus rhythm or sinus tachycardia.  No malignant arrhythmias.        ______________________________________________________________________________________________      Risk Assessment/Calculations          Physical Exam VS:  BP 106/78   Pulse 72   Ht 5' 3 (1.6 m)   Wt 140 lb (63.5 kg)   SpO2 100%   BMI 24.80 kg/m        Wt Readings from Last 3 Encounters:  12/23/24 140 lb (63.5 kg)  11/09/24 139 lb 12.8 oz (63.4 kg)  06/18/24 126 lb (57.2 kg)    GEN: Well nourished, well developed in no acute distress NECK: No JVD; No carotid bruits CARDIAC: RRR, no murmurs, rubs, gallops RESPIRATORY:  Clear to auscultation without rales, wheezing or rhonchi  ABDOMEN: Soft, non-tender, non-distended EXTREMITIES:  No edema; No deformity   ASSESSMENT AND PLAN  Atypical chest pain: Plain old treadmill testing was normal.  D-dimer was elevated, however CTA was negative for PE.  No further workup  Palpitation: Heart monitor shows rare PVCs, but no significant arrhythmia.       Dispo: Follow-up with cardiology service as needed  Signed, Scot Ford, PA  "
